# Patient Record
Sex: Male | Born: 1980 | Hispanic: Yes | Marital: Single | Smoking: Former smoker
Health system: Southern US, Community
[De-identification: ages and names within clinical notes are randomized; demographics above are authoritative.]

---

## 2022-04-02 ENCOUNTER — Emergency Department: Payer: Self-pay

## 2022-04-02 ENCOUNTER — Encounter: Payer: Self-pay | Admitting: Intensive Care

## 2022-04-02 ENCOUNTER — Other Ambulatory Visit: Payer: Self-pay

## 2022-04-02 ENCOUNTER — Inpatient Hospital Stay
Admission: EM | Admit: 2022-04-02 | Discharge: 2022-04-06 | DRG: 440 | Disposition: A | Discharge status: Home / Self Care | Payer: Self-pay | Attending: Internal Medicine | Admitting: Internal Medicine

## 2022-04-02 DIAGNOSIS — E875 Hyperkalemia: Secondary | ICD-10-CM

## 2022-04-02 DIAGNOSIS — E785 Hyperlipidemia, unspecified: Secondary | ICD-10-CM

## 2022-04-02 DIAGNOSIS — R739 Hyperglycemia, unspecified: Secondary | ICD-10-CM

## 2022-04-02 DIAGNOSIS — Z87891 Personal history of nicotine dependence: Secondary | ICD-10-CM

## 2022-04-02 DIAGNOSIS — E119 Type 2 diabetes mellitus without complications: Secondary | ICD-10-CM

## 2022-04-02 DIAGNOSIS — E1165 Type 2 diabetes mellitus with hyperglycemia: Secondary | ICD-10-CM | POA: Diagnosis present

## 2022-04-02 DIAGNOSIS — Z6832 Body mass index (BMI) 32.0-32.9, adult: Secondary | ICD-10-CM

## 2022-04-02 DIAGNOSIS — E781 Pure hyperglyceridemia: Secondary | ICD-10-CM | POA: Diagnosis present

## 2022-04-02 DIAGNOSIS — R509 Fever, unspecified: Secondary | ICD-10-CM | POA: Diagnosis not present

## 2022-04-02 DIAGNOSIS — E876 Hypokalemia: Secondary | ICD-10-CM | POA: Diagnosis not present

## 2022-04-02 DIAGNOSIS — E669 Obesity, unspecified: Secondary | ICD-10-CM | POA: Diagnosis present

## 2022-04-02 DIAGNOSIS — R1013 Epigastric pain: Secondary | ICD-10-CM

## 2022-04-02 DIAGNOSIS — K858 Other acute pancreatitis without necrosis or infection: Principal | ICD-10-CM

## 2022-04-02 DIAGNOSIS — K859 Acute pancreatitis without necrosis or infection, unspecified: Principal | ICD-10-CM

## 2022-04-02 DIAGNOSIS — R7401 Elevation of levels of liver transaminase levels: Secondary | ICD-10-CM

## 2022-04-02 DIAGNOSIS — K760 Fatty (change of) liver, not elsewhere classified: Secondary | ICD-10-CM | POA: Diagnosis present

## 2022-04-02 LAB — CBC WITH DIFFERENTIAL/PLATELET
Abs Immature Granulocytes: 0.07 10*3/uL (ref 0.00–0.07)
Basophils Absolute: 0.1 10*3/uL (ref 0.0–0.1)
Basophils Relative: 1 %
Eosinophils Absolute: 0 10*3/uL (ref 0.0–0.5)
Eosinophils Relative: 0 %
HCT: 49.5 % (ref 39.0–52.0)
Hemoglobin: 18.4 g/dL — ABNORMAL HIGH (ref 13.0–17.0)
Immature Granulocytes: 1 %
Lymphocytes Relative: 9 %
Lymphs Abs: 1.3 10*3/uL (ref 0.7–4.0)
MCH: 31.2 pg (ref 26.0–34.0)
MCHC: 37.2 g/dL — ABNORMAL HIGH (ref 30.0–36.0)
MCV: 84 fL (ref 80.0–100.0)
Monocytes Absolute: 0.6 10*3/uL (ref 0.1–1.0)
Monocytes Relative: 4 %
Neutro Abs: 12.3 10*3/uL — ABNORMAL HIGH (ref 1.7–7.7)
Neutrophils Relative %: 85 %
Platelets: 211 10*3/uL (ref 150–400)
RBC: 5.89 MIL/uL — ABNORMAL HIGH (ref 4.22–5.81)
RDW: 13.2 % (ref 11.5–15.5)
WBC: 14.4 10*3/uL — ABNORMAL HIGH (ref 4.0–10.5)
nRBC: 0 % (ref 0.0–0.2)

## 2022-04-02 LAB — URINALYSIS, ROUTINE W REFLEX MICROSCOPIC
Bacteria, UA: NONE SEEN
Bilirubin Urine: NEGATIVE
Glucose, UA: >=500 mg/dL — AB
Hgb urine dipstick: NEGATIVE
Ketones, ur: 20 mg/dL — AB
Leukocytes,Ua: NEGATIVE
Nitrite: NEGATIVE
Protein, ur: 100 mg/dL — AB
Specific Gravity, Urine: 1.04 — ABNORMAL HIGH (ref 1.005–1.030)
Squamous Epithelial / HPF: NONE SEEN (ref 0–5)
pH: 5 (ref 5.0–8.0)

## 2022-04-02 IMAGING — CR DG ABDOMEN ACUTE W/ 1V CHEST
3 series · 3 of 3 positions shown · non-contrast
Comparison: None Available.

CLINICAL DATA: Abdominal pain with nausea and vomiting.

EXAM:
DG ABDOMEN ACUTE WITH 1 VIEW CHEST

[chest pa]
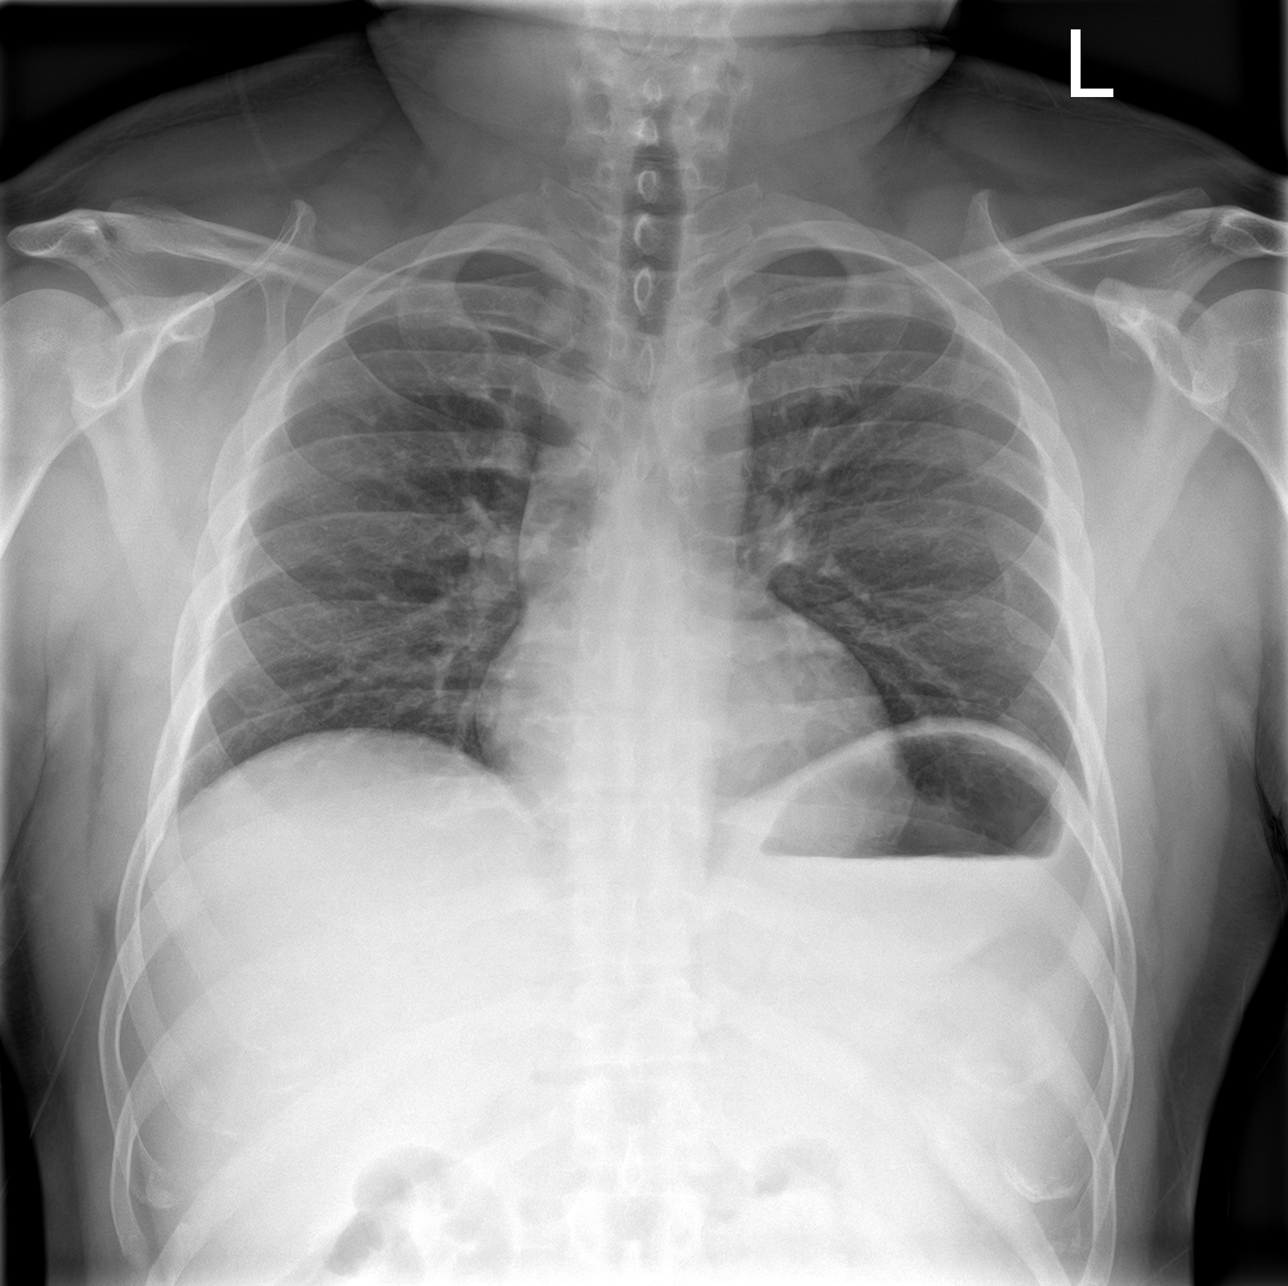

[abdomen erect]
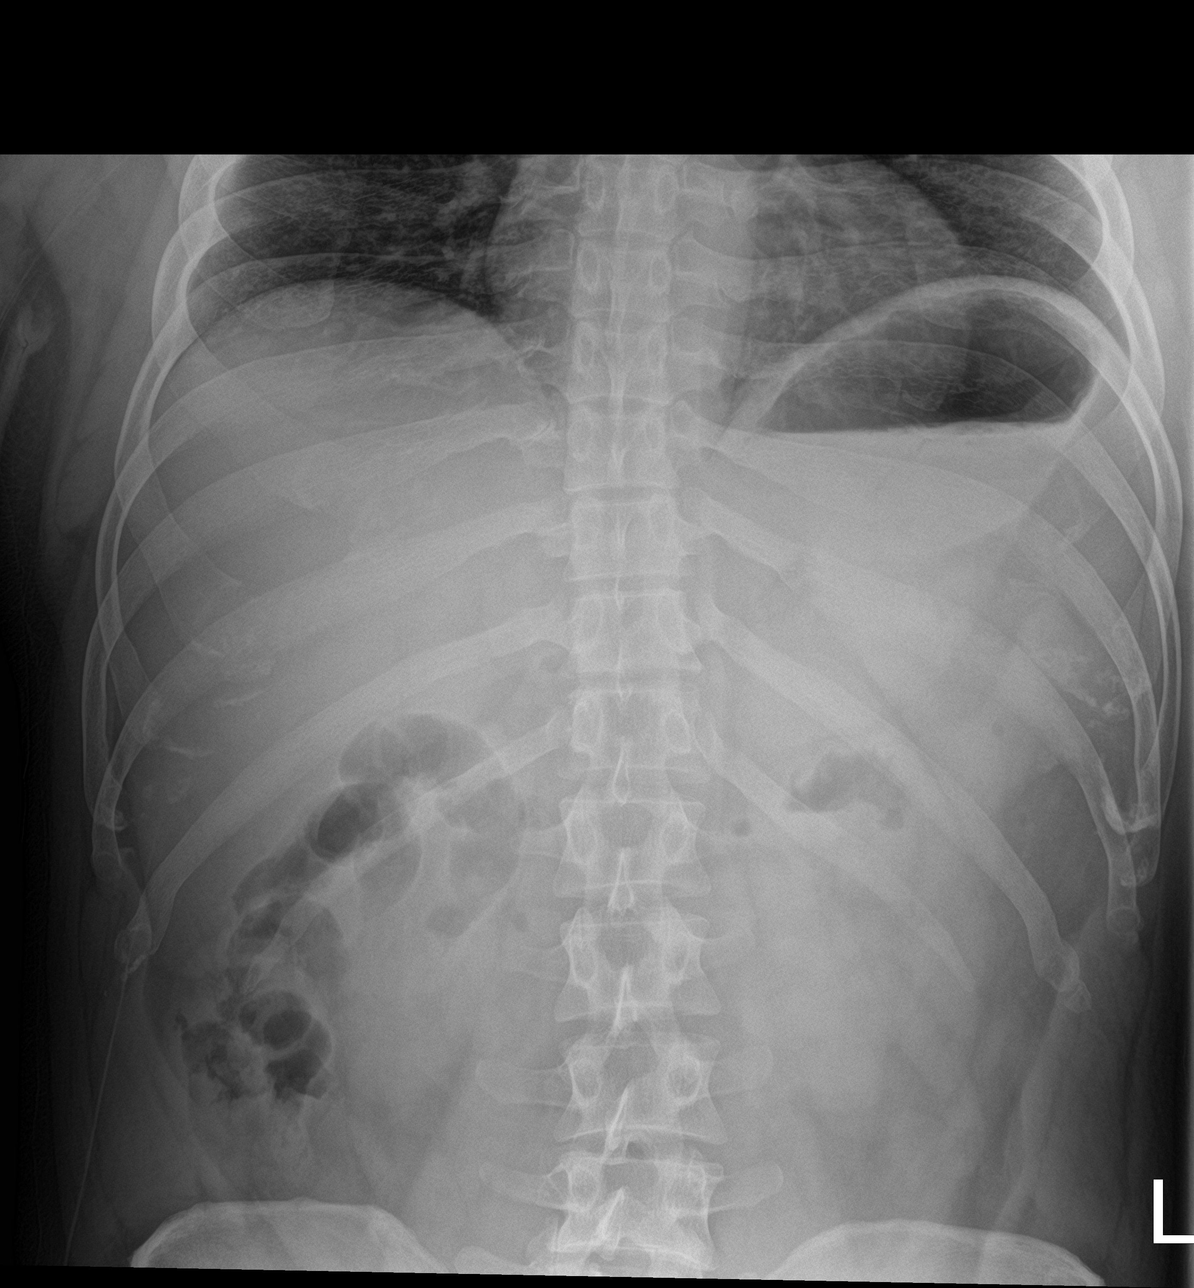

[abdomen supine]
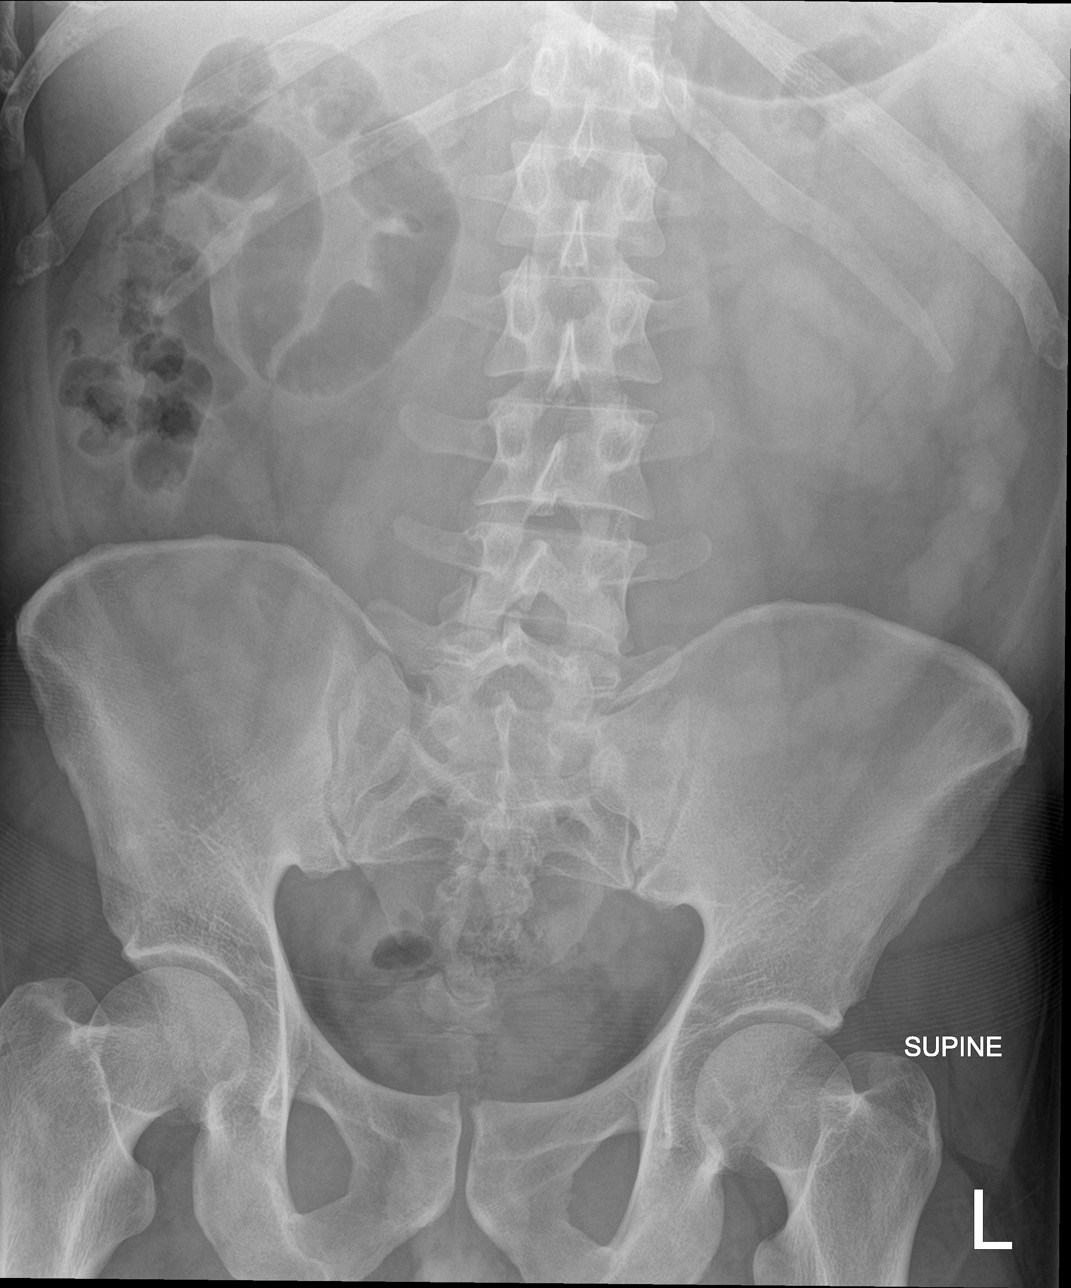

[3 of 3 positions shown; findings below may reference images not displayed]

FINDINGS: There is no evidence of dilated bowel loops or free intraperitoneal
air. No radiopaque calculi or other significant radiographic
abnormality is seen. Heart size and mediastinal contours are within
normal limits. Both lungs are clear.
IMPRESSION: Negative abdominal radiographs.  No acute cardiopulmonary disease.

## 2022-04-02 MED ORDER — MORPHINE SULFATE (PF) 4 MG/ML IV SOLN
4.0000 mg | INTRAVENOUS | Status: DC | PRN
  Administered 2022-04-02 – 2022-04-03 (×2): 4 mg via INTRAVENOUS
  Filled 2022-04-02 (×2): qty 1

## 2022-04-02 MED ORDER — SODIUM CHLORIDE 0.9 % IV BOLUS
1000.0000 mL | Freq: Once | INTRAVENOUS | Status: AC
  Administered 2022-04-02: 1000 mL via INTRAVENOUS

## 2022-04-02 MED ORDER — SODIUM CHLORIDE 0.9 % IV SOLN: Freq: Once | INTRAVENOUS | Status: AC

## 2022-04-02 MED ORDER — ONDANSETRON 4 MG PO TBDP
4.0000 mg | ORAL_TABLET | Freq: Once | ORAL | Status: AC | PRN
  Administered 2022-04-02: 4 mg via ORAL
  Filled 2022-04-02: qty 1

## 2022-04-02 MED ORDER — METOCLOPRAMIDE HCL 5 MG/ML IJ SOLN
10.0000 mg | Freq: Once | INTRAMUSCULAR | Status: AC
  Administered 2022-04-02: 10 mg via INTRAVENOUS
  Filled 2022-04-02: qty 2

## 2022-04-02 MED ORDER — PANTOPRAZOLE SODIUM 40 MG IV SOLR
40.0000 mg | Freq: Once | INTRAVENOUS | Status: AC
  Administered 2022-04-02: 40 mg via INTRAVENOUS
  Filled 2022-04-02: qty 10

## 2022-04-02 NOTE — ED Notes (Signed)
ED Provider at bedside. 

## 2022-04-02 NOTE — ED Provider Triage Note (Signed)
Emergency Medicine Provider Triage Evaluation Note  Lorris Carducci , a 41 y.o. male  was evaluated in triage.  Pt complains of left upper abdominal pain with nausea and vomiting since 12pm today. No fever. No diarrhea. No previous symptoms similar in the past.  Physical Exam  BP (!) 144/100 (BP Location: Left Arm)   Pulse 96   Temp 97.8 F (36.6 C) (Oral)   Resp 18   SpO2 97%  Gen:   Awake, no distress   Resp:  Normal effort  MSK:   Moves extremities without difficulty Other:    Medical Decision Making  Medically screening exam initiated at 5:34 PM.  Appropriate orders placed.  Slaton Kamran Coker was informed that the remainder of the evaluation will be completed by another provider, this initial triage assessment does not replace that evaluation, and the importance of remaining in the ED until their evaluation is complete.    Chinita Pester, FNP 04/02/22 1735

## 2022-04-02 NOTE — ED Triage Notes (Signed)
Patient c/o abdominal pain with N/V. Denies diarrhea. Denies urinary symptoms

## 2022-04-02 NOTE — ED Notes (Signed)
Lab tech reports pending results are due to blood samples being lipemic; pt must remain NPO for 8-12hrs before redraw can be peformed

## 2022-04-02 NOTE — ED Provider Notes (Incomplete)
   [facility] Provider Note    Event Date/Time   First MD Initiated Contact with Patient 04/02/22 1923     (approximate)   History   Abdominal Pain   HPI  Shawn Harvey is a 41 y.o. male  ***       Physical Exam   Triage Vital Signs: ED Triage Vitals  Enc Vitals Group     BP 04/02/22 1731 (!) 144/100     Pulse Rate 04/02/22 1731 96     Resp 04/02/22 1731 18     Temp 04/02/22 1731 97.8 F (36.6 C)     Temp Source 04/02/22 1731 Oral     SpO2 04/02/22 1731 97 %     Weight 04/02/22 1734 187 lb (84.8 kg)     Height 04/02/22 1734 5\' 4"  (1.626 m)     Head Circumference --      Peak Flow --      Pain Score 04/02/22 1733 10     Pain Loc --      Pain Edu? --      Excl. in GC? --     Most recent vital signs: Vitals:   04/02/22 1731  BP: (!) 144/100  Pulse: 96  Resp: 18  Temp: 97.8 F (36.6 C)  SpO2: 97%     Constitutional: Alert *** Eyes: Conjunctivae are normal.  Head: Atraumatic. Nose: No congestion/rhinnorhea. Mouth/Throat: Mucous membranes are moist.   Neck: Painless ROM.  Cardiovascular:   Good peripheral circulation. Respiratory: Normal respiratory effort.  No retractions.  Gastrointestinal: Soft and nontender.  Musculoskeletal:  no deformity Neurologic:  MAE spontaneously. No gross focal neurologic deficits are appreciated. *** Skin:  Skin is warm, dry and intact. No rash noted. Psychiatric: Mood and affect are normal. Speech and behavior are normal.    ED Results / Procedures / Treatments   Labs (all labs ordered are listed, but only abnormal results are displayed) Labs Reviewed  CBC WITH DIFFERENTIAL/PLATELET - Abnormal; Notable for the following components:      Result Value   WBC 14.4 (*)    RBC 5.89 (*)    Hemoglobin 18.4 (*)    MCHC 37.2 (*)    Neutro Abs 12.3 (*)    All other components within normal limits  URINALYSIS, ROUTINE W REFLEX MICROSCOPIC      EKG  ***   RADIOLOGY ***   PROCEDURES:  Critical Care performed: {CriticalCareYesNo:19197::"Yes, see critical care procedure note(s)","No"}  Procedures   MEDICATIONS ORDERED IN ED: Medications  ondansetron (ZOFRAN-ODT) disintegrating tablet 4 mg (4 mg Oral Given 04/02/22 1741)     IMPRESSION / MDM / ASSESSMENT AND PLAN / ED COURSE  I reviewed the triage vital signs and the nursing notes.                              Differential diagnosis includes, but is not limited to, ***      Patient's presentation is most consistent with {EM COPA:27473}   FINAL CLINICAL IMPRESSION(S) / ED DIAGNOSES   Final diagnoses:  None     Rx / DC Orders   ED Discharge Orders     None        Note:  This document was prepared using Dragon voice recognition software and may include unintentional dictation errors.

## 2022-04-03 ENCOUNTER — Emergency Department: Payer: Self-pay

## 2022-04-03 DIAGNOSIS — K858 Other acute pancreatitis without necrosis or infection: Secondary | ICD-10-CM

## 2022-04-03 DIAGNOSIS — R739 Hyperglycemia, unspecified: Secondary | ICD-10-CM

## 2022-04-03 DIAGNOSIS — E875 Hyperkalemia: Secondary | ICD-10-CM

## 2022-04-03 DIAGNOSIS — R509 Fever, unspecified: Secondary | ICD-10-CM | POA: Diagnosis not present

## 2022-04-03 DIAGNOSIS — K859 Acute pancreatitis without necrosis or infection, unspecified: Secondary | ICD-10-CM

## 2022-04-03 DIAGNOSIS — E781 Pure hyperglyceridemia: Secondary | ICD-10-CM | POA: Diagnosis present

## 2022-04-03 DIAGNOSIS — R7401 Elevation of levels of liver transaminase levels: Secondary | ICD-10-CM

## 2022-04-03 LAB — MRSA NEXT GEN BY PCR, NASAL: MRSA by PCR Next Gen: NOT DETECTED

## 2022-04-03 LAB — CBG MONITORING, ED
Glucose-Capillary: 254 mg/dL — ABNORMAL HIGH (ref 70–99)
Glucose-Capillary: 258 mg/dL — ABNORMAL HIGH (ref 70–99)
Glucose-Capillary: 229 mg/dL — ABNORMAL HIGH (ref 70–99)
Glucose-Capillary: 219 mg/dL — ABNORMAL HIGH (ref 70–99)
Glucose-Capillary: 230 mg/dL — ABNORMAL HIGH (ref 70–99)

## 2022-04-03 LAB — BASIC METABOLIC PANEL
Anion gap: 14 (ref 5–15)
Anion gap: 11 (ref 5–15)
BUN: 12 mg/dL (ref 6–20)
BUN: 12 mg/dL (ref 6–20)
CO2: 19 mmol/L — ABNORMAL LOW (ref 22–32)
CO2: 20 mmol/L — ABNORMAL LOW (ref 22–32)
Calcium: 8.3 mg/dL — ABNORMAL LOW (ref 8.9–10.3)
Calcium: 8 mg/dL — ABNORMAL LOW (ref 8.9–10.3)
Chloride: 105 mmol/L (ref 98–111)
Chloride: 103 mmol/L (ref 98–111)
Creatinine, Ser: 0.65 mg/dL (ref 0.61–1.24)
Creatinine, Ser: 0.59 mg/dL — ABNORMAL LOW (ref 0.61–1.24)
GFR, Estimated: >60 mL/min (ref >60)
GFR, Estimated: >60 mL/min (ref >60)
Glucose, Bld: 247 mg/dL — ABNORMAL HIGH (ref 70–99)
Glucose, Bld: 203 mg/dL — ABNORMAL HIGH (ref 70–99)
Potassium: 3.8 mmol/L (ref 3.5–5.1)
Potassium: 3.4 mmol/L — ABNORMAL LOW (ref 3.5–5.1)
Sodium: 138 mmol/L (ref 135–145)
Sodium: 134 mmol/L — ABNORMAL LOW (ref 135–145)

## 2022-04-03 LAB — HEPATITIS PANEL, ACUTE
HCV Ab: NONREACTIVE
Hep A IgM: NONREACTIVE
Hep B C IgM: NONREACTIVE
Hepatitis B Surface Ag: NONREACTIVE

## 2022-04-03 LAB — GLUCOSE, CAPILLARY
Glucose-Capillary: 193 mg/dL — ABNORMAL HIGH (ref 70–99)
Glucose-Capillary: 202 mg/dL — ABNORMAL HIGH (ref 70–99)
Glucose-Capillary: 149 mg/dL — ABNORMAL HIGH (ref 70–99)
Glucose-Capillary: 136 mg/dL — ABNORMAL HIGH (ref 70–99)
Glucose-Capillary: 103 mg/dL — ABNORMAL HIGH (ref 70–99)
Glucose-Capillary: 196 mg/dL — ABNORMAL HIGH (ref 70–99)
Glucose-Capillary: 216 mg/dL — ABNORMAL HIGH (ref 70–99)
Glucose-Capillary: 135 mg/dL — ABNORMAL HIGH (ref 70–99)

## 2022-04-03 LAB — CBC
HCT: 47.5 % (ref 39.0–52.0)
Hemoglobin: 17 g/dL (ref 13.0–17.0)
MCH: 30.4 pg (ref 26.0–34.0)
MCHC: 35.8 g/dL (ref 30.0–36.0)
MCV: 85 fL (ref 80.0–100.0)
Platelets: 237 10*3/uL (ref 150–400)
RBC: 5.59 MIL/uL (ref 4.22–5.81)
RDW: 14.4 % (ref 11.5–15.5)
WBC: 13.6 10*3/uL — ABNORMAL HIGH (ref 4.0–10.5)
nRBC: 0 % (ref 0.0–0.2)

## 2022-04-03 LAB — HEMOGLOBIN A1C
Hgb A1c MFr Bld: 10 % — ABNORMAL HIGH (ref 4.8–5.6)
Mean Plasma Glucose: 240.3 mg/dL

## 2022-04-03 LAB — TRIGLYCERIDES: Triglycerides: 3141 mg/dL — ABNORMAL HIGH (ref <150)

## 2022-04-03 LAB — T4, FREE: Free T4: 0.87 ng/dL (ref 0.61–1.12)

## 2022-04-03 LAB — HIV ANTIBODY (ROUTINE TESTING W REFLEX): HIV Screen 4th Generation wRfx: NONREACTIVE

## 2022-04-03 LAB — LIPASE, BLOOD: Lipase: 171 U/L — ABNORMAL HIGH (ref 11–51)

## 2022-04-03 LAB — TSH: TSH: 0.516 u[IU]/mL (ref 0.350–4.500)

## 2022-04-03 MED ORDER — BOOST / RESOURCE BREEZE PO LIQD CUSTOM
1.0000 | Freq: Three times a day (TID) | ORAL | Status: DC
  Administered 2022-04-03 – 2022-04-06 (×8): 1 via ORAL

## 2022-04-03 MED ORDER — SODIUM CHLORIDE 0.9 % IV SOLN
1.0000 g | INTRAVENOUS | Status: DC
  Administered 2022-04-03 – 2022-04-04 (×2): 1 g via INTRAVENOUS
  Filled 2022-04-03 (×2): qty 10

## 2022-04-03 MED ORDER — POTASSIUM CHLORIDE CRYS ER 20 MEQ PO TBCR
40.0000 meq | EXTENDED_RELEASE_TABLET | Freq: Once | ORAL | Status: AC
Start: 2022-04-03 — End: 2022-04-03
  Administered 2022-04-03: 40 meq via ORAL
  Filled 2022-04-03: qty 2

## 2022-04-03 MED ORDER — MORPHINE SULFATE (PF) 4 MG/ML IV SOLN
4.0000 mg | INTRAVENOUS | Status: DC | PRN
  Administered 2022-04-03: 4 mg via INTRAVENOUS
  Filled 2022-04-03: qty 1

## 2022-04-03 MED ORDER — ENOXAPARIN SODIUM 40 MG/0.4ML IJ SOSY
40.0000 mg | PREFILLED_SYRINGE | INTRAMUSCULAR | Status: DC
  Administered 2022-04-03: 40 mg via SUBCUTANEOUS
  Filled 2022-04-03: qty 0.4

## 2022-04-03 MED ORDER — PROSOURCE PLUS PO LIQD
30.0000 mL | Freq: Two times a day (BID) | ORAL | Status: DC
  Administered 2022-04-04 – 2022-04-06 (×4): 30 mL via ORAL
  Filled 2022-04-03: qty 30

## 2022-04-03 MED ORDER — DEXTROSE 5 % IV SOLN: INTRAVENOUS | Status: DC

## 2022-04-03 MED ORDER — LACTATED RINGERS IV BOLUS
1000.0000 mL | Freq: Once | INTRAVENOUS | Status: AC
  Administered 2022-04-03: 1000 mL via INTRAVENOUS

## 2022-04-03 MED ORDER — LIVING WELL WITH DIABETES BOOK - IN SPANISH
Freq: Once | Status: AC
  Filled 2022-04-03: qty 1

## 2022-04-03 MED ORDER — LACTATED RINGERS IV SOLN
INTRAVENOUS | Status: DC
Start: 2022-04-03 — End: 2022-04-03

## 2022-04-03 MED ORDER — ADULT MULTIVITAMIN W/MINERALS CH
1.0000 | ORAL_TABLET | Freq: Every day | ORAL | Status: DC
  Administered 2022-04-03 – 2022-04-05 (×3): 1 via ORAL
  Filled 2022-04-03 (×3): qty 1

## 2022-04-03 MED ORDER — INSULIN (MYXREDLIN) INFUSION FOR HYPERTRIGLYCERIDEMIA
0.1000 [IU]/kg/h | INTRAVENOUS | Status: DC
  Administered 2022-04-03 – 2022-04-04 (×4): 0.1 [IU]/kg/h via INTRAVENOUS
  Filled 2022-04-03 (×4): qty 100

## 2022-04-03 MED ORDER — IOHEXOL 350 MG/ML SOLN
75.0000 mL | Freq: Once | INTRAVENOUS | Status: AC | PRN
  Administered 2022-04-03: 75 mL via INTRAVENOUS

## 2022-04-03 MED ORDER — DEXTROSE 50 % IV SOLN: 50.0000 mL | INTRAVENOUS | Status: DC | PRN

## 2022-04-03 MED ORDER — INSULIN REGULAR(HUMAN) IN NACL 100-0.9 UT/100ML-% IV SOLN
INTRAVENOUS | Status: DC
Start: 2022-04-03 — End: 2022-04-03
  Administered 2022-04-03: 5.5 [IU]/h via INTRAVENOUS
  Filled 2022-04-03: qty 100

## 2022-04-03 MED ORDER — CHLORHEXIDINE GLUCONATE CLOTH 2 % EX PADS
6.0000 | MEDICATED_PAD | Freq: Every day | CUTANEOUS | Status: DC
  Administered 2022-04-03 – 2022-04-06 (×4): 6 via TOPICAL

## 2022-04-03 MED ORDER — DEXTROSE 50 % IV SOLN: 0.0000 mL | INTRAVENOUS | Status: DC | PRN

## 2022-04-03 MED ORDER — ACETAMINOPHEN 325 MG PO TABS
650.0000 mg | ORAL_TABLET | Freq: Four times a day (QID) | ORAL | Status: DC | PRN
  Administered 2022-04-03: 650 mg via ORAL
  Filled 2022-04-03: qty 2

## 2022-04-03 MED ORDER — DEXTROSE IN LACTATED RINGERS 5 % IV SOLN
INTRAVENOUS | Status: DC
Start: 2022-04-03 — End: 2022-04-03

## 2022-04-03 MED ORDER — ONDANSETRON HCL 4 MG/2ML IJ SOLN
4.0000 mg | Freq: Four times a day (QID) | INTRAMUSCULAR | Status: DC | PRN
  Administered 2022-04-03: 4 mg via INTRAVENOUS
  Filled 2022-04-03: qty 2

## 2022-04-03 NOTE — ED Provider Notes (Signed)
-----------------------------------------   12:17 AM on 04/03/2022 -----------------------------------------  Assuming care from Dr. Roxan Hockey.  In short, Shawn Harvey is a 41 y.o. male with a chief complaint of abdominal pain.  Refer to the original H&P for additional details.  The current plan of care is to follow-up on labs.  There has been issues due to the patient's hyperlipemia blood and it is required multiple rechecks.  CT scan is also pending..   Clinical Course as of 04/03/22 0852  Thu Apr 02, 2022  2043 X-ray by my interpretation does not show any evidence of infiltrate or obstructive pattern. [PR]  2318 Due to persistently lipemic blood sample is unable to obtain lipase and CMP.  Will order CT imaging given concern for pancreatitis [PR]  2355 After multiple times calling lab they are not still not releasing the patient's metabolic panel saying that his blood work is to lipemic to result.  We will give additional IV fluids will scan without contrast. [PR]  Fri Apr 03, 2022  0024 Patient be signed out to oncoming physician pending follow-up blood work as well as CT imaging.  High suspicion for lipemic pancreatitis. [PR]  0227 I discussed the results with the lab because the blood results have still not been resulted.  She said that there is a substantial degree of hemolysis that is somehow related to the degree of hyperlipemia.  She said that the creatinine is normal and the LFTs are essentially normal, but the potassium is extremely elevated likely due to the hemolysis.  The patient's triglycerides are greater than 3000.  Lipase also is affected by hemolysis but seems to be elevated but not substantially so, although there is little confidence in the accuracy of the results.  Under the circumstances the patient is appropriate for admission and further management of his pancreatitis.  I also personally reviewed and interpreted his CT scan and there is obvious inflammation around  the pancreas.  The radiologist confirmed marked pancreatitis with no obvious pseudocyst.  Consulting Dr. Para March with the hospitalist service for admission. [CF]  7846 Consulted Dr. Para March and discussed the case in detail.  She will admit the patient and put in orders for an insulin infusion with D5 normal saline infusion if glucose drops less than 200.  I also reassessed the patient.  He said he feels about the same.  He has some mild generalized tenderness to palpation of the abdomen but no evidence of rigidity or abdominal compartment syndrome at this time. [CF]    Clinical Course User Index [CF] Loleta Rose, MD [PR] Willy Eddy, MD     Loleta Rose, MD 04/03/22 [phone]

## 2022-04-03 NOTE — Progress Notes (Signed)
Patient reports feeling a little better, pain now 4/10. Consumed small amount clear liquid tray. Insulin drip and IV fluid orders have been updated; doses changed according new orders.

## 2022-04-03 NOTE — Plan of Care (Signed)

## 2022-04-03 NOTE — Progress Notes (Signed)
Initial Nutrition Assessment  DOCUMENTATION CODES:   Obesity unspecified  INTERVENTION:   -30 ml Prosource Plus BID, each supplement provides 100 kcals and 15 grams protein -Boost Breeze po TID, each supplement provides 250 kcal and 9 grams of protein  -MVI with minerals daily -RD will follow for diet advancement and adjust supplement regimen as appropriate  NUTRITION DIAGNOSIS:   Increased nutrient needs related to acute illness (pancreatitis) as evidenced by estimated needs.  GOAL:   Patient will meet greater than or equal to 90% of their needs  MONITOR:   PO intake, Supplement acceptance, Diet advancement  REASON FOR ASSESSMENT:   Consult Assessment of nutrition requirement/status  ASSESSMENT:   Pt with no significant past medical history who presents with 24-hour history of epigastric pain and intractable nausea and vomiting.  Pt admitted with acute pancreatitis and hypertriglyceridemia.   Reviewed I/O's: +3.3 L x 24 hours   Spoke with pt at bedside, who reports feeling a little bit better today. He reports he was unable to keep foods and liquids down for 3 days PTA secondary to nausea, vomiting, and abdominal pain. Pt shares that he was able to consume some clear liquids for breakfast and while he did tolerate, he did not eat much as he reported it made his stomach hurt.   Prior to acute illness, pt shares he has a good appetite. Pt shares that he lives alone and eats out and cooks at home. He usually has a large breakfast and lunch and a smaller dinner. Meals cooked at home consist or fried chicken or hamburgers and pt usually eats [place] food or fast food when eating out.   Pt denies any weight loss. He reports his UBW is around 180#.   Discussed with pt rationale for clear liquid diet and possibility for diet advancement. Pt amenable to nutritional supplements.   Medications reviewed and include lactated ringers infusion @ 125 ml/hr and dextrose 5% in lactated  ringers infusion @ 125 ml/hr.   Lab Results  Component Value Date   HGBA1C 10.0 (H) 04/03/2022   PTA DM medications are none.   Labs reviewed: Na: 134, K: 3.4, CBGS: 193-230 (inpatient orders for glycemic control are IV insulin drip).    NUTRITION - FOCUSED PHYSICAL EXAM:  Flowsheet Row Most Recent Value  Orbital Region No depletion  Upper Arm Region No depletion  Thoracic and Lumbar Region No depletion  Buccal Region No depletion  Temple Region No depletion  Clavicle Bone Region No depletion  Clavicle and Acromion Bone Region No depletion  Scapular Bone Region No depletion  Dorsal Hand No depletion  Patellar Region No depletion  Anterior Thigh Region No depletion  Posterior Calf Region No depletion  Edema (RD Assessment) None  Hair Reviewed  Eyes Reviewed  Mouth Reviewed  Skin Reviewed  Nails Reviewed       Diet Order:   Diet Order             Diet clear liquid Room service appropriate? Yes; Fluid consistency: Thin  Diet effective now                   EDUCATION NEEDS:   Education needs have been addressed  Skin:  Skin Assessment: Reviewed RN Assessment  Last BM:  04/02/22  Height:   Ht Readings from Last 1 Encounters:  04/02/22 5\' 4"  (1.626 m)    Weight:   Wt Readings from Last 1 Encounters:  04/03/22 84.8 kg    Ideal Body Weight:  59.1 kg  BMI:  Body mass index is 32.09 kg/m.  Estimated Nutritional Needs:   Kcal:  1850-2050  Protein:  105-120 grams  Fluid:  > 1.8 L    Levada Schilling, RD, LDN, CDCES Registered Dietitian II Certified Diabetes Care and Education Specialist Please refer to [facility] for RD and/or RD on-call/weekend/after hours pager

## 2022-04-03 NOTE — Progress Notes (Signed)
Patient noted to have fever. Discussed with Dr. Myriam Forehand. Received orders for Tylenol, antibiotics, as well as prn D50 and blood sugar has trended down.

## 2022-04-04 DIAGNOSIS — R739 Hyperglycemia, unspecified: Secondary | ICD-10-CM

## 2022-04-04 DIAGNOSIS — R509 Fever, unspecified: Secondary | ICD-10-CM

## 2022-04-04 LAB — GLUCOSE, CAPILLARY
Glucose-Capillary: 117 mg/dL — ABNORMAL HIGH (ref 70–99)
Glucose-Capillary: 114 mg/dL — ABNORMAL HIGH (ref 70–99)
Glucose-Capillary: 110 mg/dL — ABNORMAL HIGH (ref 70–99)
Glucose-Capillary: 130 mg/dL — ABNORMAL HIGH (ref 70–99)
Glucose-Capillary: 108 mg/dL — ABNORMAL HIGH (ref 70–99)
Glucose-Capillary: 107 mg/dL — ABNORMAL HIGH (ref 70–99)
Glucose-Capillary: 153 mg/dL — ABNORMAL HIGH (ref 70–99)
Glucose-Capillary: 114 mg/dL — ABNORMAL HIGH (ref 70–99)
Glucose-Capillary: 108 mg/dL — ABNORMAL HIGH (ref 70–99)
Glucose-Capillary: 114 mg/dL — ABNORMAL HIGH (ref 70–99)
Glucose-Capillary: 115 mg/dL — ABNORMAL HIGH (ref 70–99)

## 2022-04-04 LAB — BASIC METABOLIC PANEL
Anion gap: 9 (ref 5–15)
BUN: 10 mg/dL (ref 6–20)
CO2: 23 mmol/L (ref 22–32)
Calcium: 7.8 mg/dL — ABNORMAL LOW (ref 8.9–10.3)
Chloride: 98 mmol/L (ref 98–111)
Creatinine, Ser: 0.55 mg/dL — ABNORMAL LOW (ref 0.61–1.24)
GFR, Estimated: >60 mL/min (ref >60)
Glucose, Bld: 86 mg/dL (ref 70–99)
Potassium: 2.9 mmol/L — ABNORMAL LOW (ref 3.5–5.1)
Sodium: 130 mmol/L — ABNORMAL LOW (ref 135–145)

## 2022-04-04 LAB — TRIGLYCERIDES: Triglycerides: 1141 mg/dL — ABNORMAL HIGH (ref <150)

## 2022-04-04 LAB — LIPASE, BLOOD: Lipase: 68 U/L — ABNORMAL HIGH (ref 11–51)

## 2022-04-04 LAB — MAGNESIUM: Magnesium: 1.8 mg/dL (ref 1.7–2.4)

## 2022-04-04 LAB — PHOSPHORUS: Phosphorus: 2.2 mg/dL — ABNORMAL LOW (ref 2.5–4.6)

## 2022-04-04 MED ORDER — POTASSIUM CHLORIDE CRYS ER 20 MEQ PO TBCR
40.0000 meq | EXTENDED_RELEASE_TABLET | Freq: Once | ORAL | Status: AC
Start: 2022-04-04 — End: 2022-04-04
  Administered 2022-04-04: 40 meq via ORAL
  Filled 2022-04-04: qty 2

## 2022-04-04 MED ORDER — POTASSIUM CHLORIDE 10 MEQ/100ML IV SOLN
10.0000 meq | INTRAVENOUS | Status: AC
  Administered 2022-04-04 (×4): 10 meq via INTRAVENOUS
  Filled 2022-04-04 (×4): qty 100

## 2022-04-04 MED ORDER — K PHOS MONO-SOD PHOS DI & MONO 155-852-130 MG PO TABS
500.0000 mg | ORAL_TABLET | Freq: Three times a day (TID) | ORAL | Status: AC
  Administered 2022-04-04 – 2022-04-05 (×3): 500 mg via ORAL
  Filled 2022-04-04 (×3): qty 2

## 2022-04-04 MED ORDER — MAGNESIUM OXIDE -MG SUPPLEMENT 400 (240 MG) MG PO TABS
400.0000 mg | ORAL_TABLET | Freq: Once | ORAL | Status: AC
  Administered 2022-04-04: 400 mg via ORAL
  Filled 2022-04-04: qty 1

## 2022-04-04 MED ORDER — DEXTROSE-NACL 5-0.9 % IV SOLN: INTRAVENOUS | Status: DC

## 2022-04-05 DIAGNOSIS — E119 Type 2 diabetes mellitus without complications: Secondary | ICD-10-CM

## 2022-04-05 LAB — GLUCOSE, CAPILLARY
Glucose-Capillary: 104 mg/dL — ABNORMAL HIGH (ref 70–99)
Glucose-Capillary: 88 mg/dL (ref 70–99)
Glucose-Capillary: 90 mg/dL (ref 70–99)
Glucose-Capillary: 91 mg/dL (ref 70–99)
Glucose-Capillary: 72 mg/dL (ref 70–99)
Glucose-Capillary: 165 mg/dL — ABNORMAL HIGH (ref 70–99)
Glucose-Capillary: 109 mg/dL — ABNORMAL HIGH (ref 70–99)
Glucose-Capillary: 177 mg/dL — ABNORMAL HIGH (ref 70–99)

## 2022-04-05 LAB — COMPREHENSIVE METABOLIC PANEL
ALT: 26 U/L (ref 0–44)
AST: 23 U/L (ref 15–41)
Albumin: 3 g/dL — ABNORMAL LOW (ref 3.5–5.0)
Alkaline Phosphatase: 55 U/L (ref 38–126)
Anion gap: 7 (ref 5–15)
BUN: 7 mg/dL (ref 6–20)
CO2: 22 mmol/L (ref 22–32)
Calcium: 8.3 mg/dL — ABNORMAL LOW (ref 8.9–10.3)
Chloride: 105 mmol/L (ref 98–111)
Creatinine, Ser: 0.51 mg/dL — ABNORMAL LOW (ref 0.61–1.24)
GFR, Estimated: >60 mL/min (ref >60)
Glucose, Bld: 86 mg/dL (ref 70–99)
Potassium: 2.7 mmol/L — CL (ref 3.5–5.1)
Sodium: 134 mmol/L — ABNORMAL LOW (ref 135–145)
Total Bilirubin: 0.9 mg/dL (ref 0.3–1.2)
Total Protein: 6.7 g/dL (ref 6.5–8.1)

## 2022-04-05 LAB — CBC
HCT: 42.6 % (ref 39.0–52.0)
Hemoglobin: 14.2 g/dL (ref 13.0–17.0)
MCH: 28 pg (ref 26.0–34.0)
MCHC: 33.3 g/dL (ref 30.0–36.0)
MCV: 83.9 fL (ref 80.0–100.0)
Platelets: 115 10*3/uL — ABNORMAL LOW (ref 150–400)
RBC: 5.08 MIL/uL (ref 4.22–5.81)
RDW: 13.7 % (ref 11.5–15.5)
WBC: 9.1 10*3/uL (ref 4.0–10.5)
nRBC: 0 % (ref 0.0–0.2)

## 2022-04-05 LAB — TRIGLYCERIDES
Triglycerides: 539 mg/dL — ABNORMAL HIGH (ref <150)
Triglycerides: 513 mg/dL — ABNORMAL HIGH (ref <150)

## 2022-04-05 LAB — PHOSPHORUS: Phosphorus: 2.6 mg/dL (ref 2.5–4.6)

## 2022-04-05 LAB — MAGNESIUM: Magnesium: 1.9 mg/dL (ref 1.7–2.4)

## 2022-04-05 MED ORDER — POTASSIUM CHLORIDE 10 MEQ/100ML IV SOLN
10.0000 meq | INTRAVENOUS | Status: AC
  Administered 2022-04-05 (×4): 10 meq via INTRAVENOUS
  Filled 2022-04-05 (×4): qty 100

## 2022-04-05 MED ORDER — INSULIN ASPART 100 UNIT/ML IJ SOLN
0.0000 [IU] | Freq: Three times a day (TID) | INTRAMUSCULAR | Status: DC
  Administered 2022-04-05: 3 [IU] via SUBCUTANEOUS
  Administered 2022-04-06: 5 [IU] via SUBCUTANEOUS
  Filled 2022-04-05 (×3): qty 1

## 2022-04-05 MED ORDER — POTASSIUM CHLORIDE CRYS ER 20 MEQ PO TBCR
40.0000 meq | EXTENDED_RELEASE_TABLET | Freq: Once | ORAL | Status: AC
Start: 2022-04-05 — End: 2022-04-05
  Administered 2022-04-05: 40 meq via ORAL
  Filled 2022-04-05: qty 2

## 2022-04-05 MED ORDER — FENOFIBRATE 54 MG PO TABS
54.0000 mg | ORAL_TABLET | Freq: Every day | ORAL | Status: DC
  Administered 2022-04-05: 54 mg via ORAL
  Filled 2022-04-05 (×2): qty 1

## 2022-04-06 ENCOUNTER — Other Ambulatory Visit: Payer: Self-pay

## 2022-04-06 DIAGNOSIS — E1165 Type 2 diabetes mellitus with hyperglycemia: Secondary | ICD-10-CM

## 2022-04-06 LAB — BASIC METABOLIC PANEL
Anion gap: 8 (ref 5–15)
BUN: 11 mg/dL (ref 6–20)
CO2: 24 mmol/L (ref 22–32)
Calcium: 8.9 mg/dL (ref 8.9–10.3)
Chloride: 103 mmol/L (ref 98–111)
Creatinine, Ser: 0.66 mg/dL (ref 0.61–1.24)
GFR, Estimated: >60 mL/min (ref >60)
Glucose, Bld: 185 mg/dL — ABNORMAL HIGH (ref 70–99)
Potassium: 3.6 mmol/L (ref 3.5–5.1)
Sodium: 135 mmol/L (ref 135–145)

## 2022-04-06 LAB — GLUCOSE, CAPILLARY: Glucose-Capillary: 212 mg/dL — ABNORMAL HIGH (ref 70–99)

## 2022-04-06 LAB — TRIGLYCERIDES: Triglycerides: 571 mg/dL — ABNORMAL HIGH (ref <150)

## 2022-04-06 MED ORDER — FENOFIBRATE 160 MG PO TABS
160.0000 mg | ORAL_TABLET | Freq: Every day | ORAL | 0 refills | Status: AC
Start: 2022-04-06 — End: ?
  Filled 2022-04-06 (×2): qty 30, 30d supply, fill #0

## 2022-04-06 MED ORDER — GLIPIZIDE 5 MG PO TABS
2.5000 mg | ORAL_TABLET | Freq: Two times a day (BID) | ORAL | 0 refills | Status: AC
  Filled 2022-04-06: qty 60, 60d supply, fill #0

## 2022-04-06 MED ORDER — FENOFIBRATE 160 MG PO TABS
160.0000 mg | ORAL_TABLET | Freq: Every day | ORAL | Status: DC
  Administered 2022-04-06: 160 mg via ORAL
  Filled 2022-04-06: qty 1

## 2022-04-06 MED ORDER — BLOOD GLUCOSE MONITOR KIT: PACK | 0 refills | Status: AC

## 2022-04-06 MED ORDER — METFORMIN HCL 500 MG PO TABS
500.0000 mg | ORAL_TABLET | Freq: Two times a day (BID) | ORAL | 0 refills | Status: AC
  Filled 2022-04-06: qty 60, 30d supply, fill #0

## 2022-04-06 NOTE — Progress Notes (Signed)
RN discussed discharge paperwork and home needs for pt.  Pt's family is coming to pick him up as the medications were delivered to the bedside.  RN and pt q&a'd about the glucometer, strips, and lancets as well as discussed getting insurance and a PCP.  Pt was awaiting ride at the end of discussion.

## 2022-04-06 NOTE — TOC CM/SW Note (Signed)
[facility] will call [facility]W when prescriptions are ready. Will ask volunteer services to go pick them up when ready. RN is aware.  Charlynn Court, CSW [phone]

## 2022-04-06 NOTE — Plan of Care (Signed)
  RD consulted for nutrition education regarding diabetes.   Lab Results  Component Value Date   HGBA1C 10.0 (H) 04/03/2022   Spoke with pt at bedside, who reports feeling better and appetite has improved. Noted that pt consumed about 75% of breakfast tray. He denies abdominal pain with eating.   Pt reports he was not told he has DM (DM coordinator educated pt on 04/03/22 and MD also discussed with him over the weekend). Per pt, he does not have a family history of DM. RD reinforced importance of close monitoring of DM with self-management principes (checking blood sugars, following up with PCP, and establishing and regular meal schedule). TOC has provided him with information regarding the [facility].   Per pt, he drinks a lot of water, but also drinks orange juice, Sunny D, and Gatorade. Discussed nutritional content in beverages as well as lower sugar alternatives, like Powerade Zero and G2 Gatorade. Pt asked good, thoughtful questions; reviewed ways to decrease simple sugars in diet and increase complex carbohydrates. Encouraged eating whole fruit and fruit canned in juice over fruit juices and sugary beverages.   RD provided "Carbohydrate Counting for People with Diabetes" (in spanish)handout from the Academy of Nutrition and Dietetics. Discussed different food groups and their effects on blood sugar, emphasizing carbohydrate-containing foods. Provided list of carbohydrates and recommended serving sizes of common foods.  Discussed importance of controlled and consistent carbohydrate intake throughout the day. Provided examples of ways to balance meals/snacks and encouraged intake of high-fiber, whole grain complex carbohydrates. Teach back method used.  Expect fair compliance.  Body mass index is 32.09 kg/m. Pt meets criteria for obesity, class I based on current BMI.  Current diet order is carb modified, patient is consuming approximately 100% of meals at this time. Labs and  medications reviewed. No further nutrition interventions warranted at this time. RD contact information provided. If additional nutrition issues arise, please re-consult RD.  Levada Schilling, RD, LDN, CDCES Registered Dietitian II Certified Diabetes Care and Education Specialist Please refer to [facility] for RD and/or RD on-call/weekend/after hours pager

## 2022-04-08 LAB — CULTURE, BLOOD (ROUTINE X 2)
Culture: NO GROWTH
Special Requests: ADEQUATE

## 2022-04-08 NOTE — Progress Notes (Signed)
PHARMACY - PHYSICIAN COMMUNICATION CRITICAL VALUE ALERT - BLOOD CULTURE IDENTIFICATION (BCID)  Shawn Harvey is an 42 y.o. male who presented to [facility] on 04/02/2022 with a chief complaint of abdominal pain  Assessment:  6/23 blood culture resulted 6/28 with GPR in 1 of 4 bottles,  no BCID performed.  Patient had pancreatitis d/e hypertriglyceridemia  Name of physician (or Provider) Contacted: Dr Myriam Forehand  Current antibiotics: NA  Changes to prescribed antibiotics recommended:  Possible contaminant.  Provider to follow-up  No results found for this or any previous visit.  Juliette Alcide, PharmD, BCPS, BCIDP Work Cell: [phone] 04/08/2022 3:58 PM

## 2022-04-11 LAB — CULTURE, BLOOD (ROUTINE X 2): Special Requests: ADEQUATE
